# Patient Record
Sex: Male | Born: 1980 | Hispanic: Yes | Marital: Married
Health system: Southern US, Community
[De-identification: ages and names within clinical notes are randomized; demographics above are authoritative.]

---

## 2014-07-24 ENCOUNTER — Encounter (HOSPITAL_COMMUNITY): Payer: Self-pay

## 2014-07-24 ENCOUNTER — Emergency Department (HOSPITAL_COMMUNITY): Payer: Self-pay

## 2014-07-24 ENCOUNTER — Emergency Department (HOSPITAL_COMMUNITY)
Admission: EM | Admit: 2014-07-24 | Discharge: 2014-07-24 | Disposition: A | Discharge status: Home / Self Care | Payer: Self-pay | Attending: Emergency Medicine | Admitting: Emergency Medicine

## 2014-07-24 DIAGNOSIS — M549 Dorsalgia, unspecified: Secondary | ICD-10-CM | POA: Insufficient documentation

## 2014-07-24 DIAGNOSIS — R300 Dysuria: Secondary | ICD-10-CM | POA: Insufficient documentation

## 2014-07-24 LAB — URINALYSIS, ROUTINE W REFLEX MICROSCOPIC
Bilirubin Urine: NEGATIVE
Glucose, UA: NEGATIVE mg/dL
Hgb urine dipstick: NEGATIVE
Ketones, ur: NEGATIVE mg/dL
Leukocytes, UA: NEGATIVE
Nitrite: NEGATIVE
Protein, ur: NEGATIVE mg/dL
Specific Gravity, Urine: 1.025 (ref 1.005–1.030)
Urobilinogen, UA: 1 mg/dL (ref 0.0–1.0)
pH: 6 (ref 5.0–8.0)

## 2014-07-24 MED ORDER — TRAMADOL HCL 50 MG PO TABS: 50.0000 mg | ORAL_TABLET | Freq: Four times a day (QID) | ORAL | Status: AC | PRN

## 2014-07-24 MED ORDER — IBUPROFEN 600 MG PO TABS: 600.0000 mg | ORAL_TABLET | Freq: Four times a day (QID) | ORAL | Status: AC | PRN

## 2014-07-24 NOTE — ED Notes (Signed)
Declined W/C at D/C and was escorted to lobby by RN. 

## 2014-07-24 NOTE — ED Provider Notes (Signed)
CSN: [id number]     Arrival date & time 07/24/14  1315 History  This chart was scribed for non-physician practitioner, Kelle Darting Adyn Serna, PA-C, working with Eber Hong, MD by Charline Bills, ED Scribe. This patient was seen in room TR07C/TR07C and the patient's care was started at 2:58 PM.   Chief Complaint  Patient presents with  . Back Pain   The history is provided by the patient. The history is limited by a language barrier. A language interpreter was used.   HPI Comments: Dean Orozco is a 34 y.o. male, with no pertinent medical history, who presents to the Emergency Department complaining of gradually worsening back pain for 2 years. Pt initially noted back pain 2-3 years ago when Dean Orozco fell from a tree. Pt was not seen or treated after fall; Dean Orozco states that Dean Orozco just took medication for pain. Pain is exacerbated with bending, lying down, standing up and lifting. Pt denies pain at rest. Dean Orozco reports associated mild dysuria onset 3 months ago. Dean Orozco denies numbness/tingling in lower extremities, weakness in lower extremities, urinary or bowel incontinence, penile discharge, difficulty urinating. Pt has been treating with unknown medication with mild relief. No h/o liver or kidney complications.   History reviewed. No pertinent past medical history. History reviewed. No pertinent past surgical history. No family history on file. History  Substance Use Topics  . Smoking status: Not on file  . Smokeless tobacco: Not on file  . Alcohol Use: Not on file    Review of Systems  Genitourinary: Positive for dysuria. Negative for discharge and difficulty urinating.  Musculoskeletal: Positive for back pain.  Neurological: Negative for weakness and numbness.  All other systems reviewed and are negative.  Allergies  Review of patient's allergies indicates no known allergies.  Home Medications   Prior to Admission medications   Not on File   BP 124/78 mmHg  Pulse 65  Temp(Src) 98.7 F  (37.1 C)  Resp 16  SpO2 100% Physical Exam  Constitutional: Dean Orozco is oriented to person, place, and time. Dean Orozco appears well-developed and well-nourished. No distress.  HENT:  Head: Normocephalic and atraumatic.  Eyes: Conjunctivae and EOM are normal.  Neck: Neck supple. No tracheal deviation present.  Cardiovascular: Normal rate.   Pulmonary/Chest: Effort normal. No respiratory distress.  Musculoskeletal: Normal range of motion.  Acute pain with forward flexion. No radiation of symptoms with forward flexion. No c,t or l spine tenderness. No paravertebral tenderness.   Neurological: Dean Orozco is alert and oriented to person, place, and time.  Skin: Skin is warm and dry.  Psychiatric: Dean Orozco has a normal mood and affect. His behavior is normal.  Nursing note and vitals reviewed.  ED Course  Procedures (including critical care time) DIAGNOSTIC STUDIES: Oxygen Saturation is 100% on RA, normal by my interpretation.    COORDINATION OF CARE: 3:14 PM-Discussed treatment plan which includes UA, XR and follow-up with PCP with pt at bedside and pt agreed to plan.   Labs Review Labs Reviewed - No data to display  Imaging Review Dg Lumbar Spine 2-3 Views  07/24/2014   CLINICAL DATA:  Back pain beginning today. No recent injury. Initial encounter.  EXAM: LUMBAR SPINE - 2-3 VIEW  COMPARISON:  None.  FINDINGS: Vertebral body height and alignment are normal. Intervertebral disc space height is maintained with mild anterior endplate spurring at L3-4 and L4-5. Mild convex left curvature is identified. Imaged paraspinous structures are unremarkable.  IMPRESSION: No acute abnormality.  Mild convex left curvature of mild degenerative  change.   Electronically Signed   By: Drusilla Kannerhomas  Dalessio M.D.   On: 07/24/2014 16:32     EKG Interpretation None     Interpreter asked the pt multiple times the same question but pt did not directly answer the question. Dean Orozco kept referring to back pain when asked multiple times about  numbness/tingling and weakness in lower extremities.   MDM   Final diagnoses:  None   Pt was evaluated with the use of an interpretor. Pt was difficult to interview as Dean Orozco kept trying to explain his back pain and would not elaborate on other details.    Labs: Urinalysis negative  Assessment/ Plan   Pt's pain has been chronic for the last 2 years with no change in pain level. X-ray showed no signs of trauma or abnormalities. Dean Orozco has no pain at rest and only with movement. No red flags. Dean Orozco was instructed to use Ibuprofen as needed for pain and Tramadol for severe pain. It was stressed to him that further follow-up with specialist is needed for management of chronic pain. Dean Orozco was instructed to avoid heavy activities that worsen back pain and to return for worsening signs. Pt understood and agreed to plan.   I personally performed the services described in this documentation, which was scribed in my presence. The recorded information has been reviewed and is accurate.    Eyvonne MechanicJeffrey Sylvanna Burggraf, PA-C 07/24/14 1711  Eber HongBrian Miller, MD 07/24/14 [phone]

## 2014-07-24 NOTE — Discharge Instructions (Signed)

## 2014-07-24 NOTE — ED Notes (Signed)
Per interpreter, patient is complaining of back pain x1 year, denies numbness/tingling in arms or legs. States he has been seen at a clinic for the same but is out and states the pain is back.

## 2014-07-24 NOTE — ED Provider Notes (Signed)
Patient seen and examined by me. Patient with acute exacerbation of chronic pain. States he fell about 3 years ago, was not seen at that time, pain on and off since then. Pain is in the lower back, with no radiation to the extremities. No loss of bladder or bowel control. No numbness or weakness in legs. No difficulty ambulating. States pain is worsened with leaning forward and picking up heavy objects. Exam unremarkable. No evidence of cauda equina. He is afebrile. No IV drug use. X-rays and UA  Obtained and are negative. Interpreter phone was used to communicate with patient. Will discharge him home with outpatient follow-up. Patient will be given ibuprofen and tramadol for his symptoms. Return precautions discussed.  Filed Vitals:   07/24/14 1330  BP: 124/78  Pulse: 65  Temp: 98.7 F (37.1 C)  Resp: 16  SpO2: 100%     Jaynie Crumbleatyana Tonio Seider, PA-C 07/24/14 1655  Eber HongBrian Miller, MD 07/24/14 2044

## 2014-07-24 NOTE — ED Notes (Signed)
Pt to BR to collect urine sample.

## 2016-04-06 IMAGING — CR DG LUMBAR SPINE 2-3V
3 series · 3 of 3 positions shown · non-contrast
Comparison: None.

CLINICAL DATA: Back pain beginning today. No recent injury. Initial
encounter.

EXAM:
LUMBAR SPINE - 2-3 VIEW

[l-spine ap]
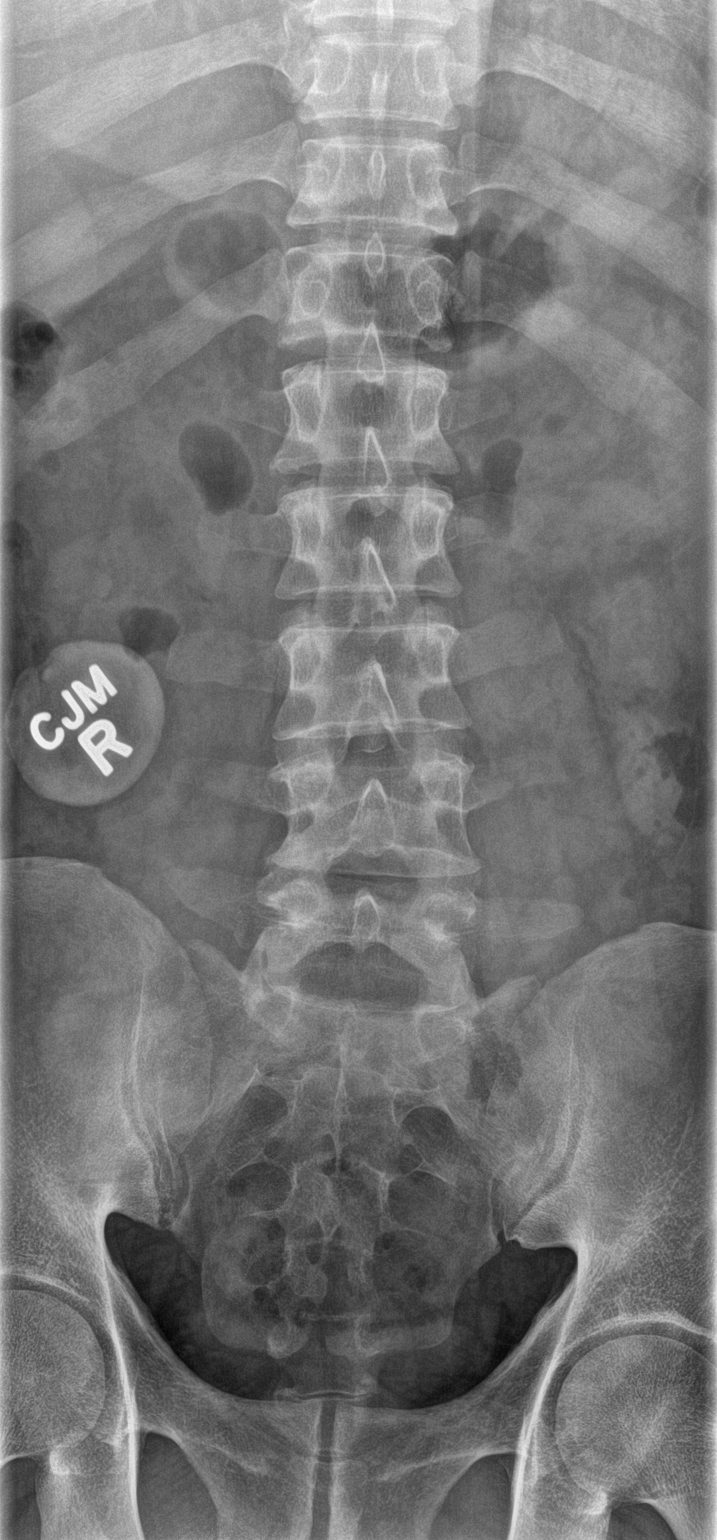

[l-spine lat]
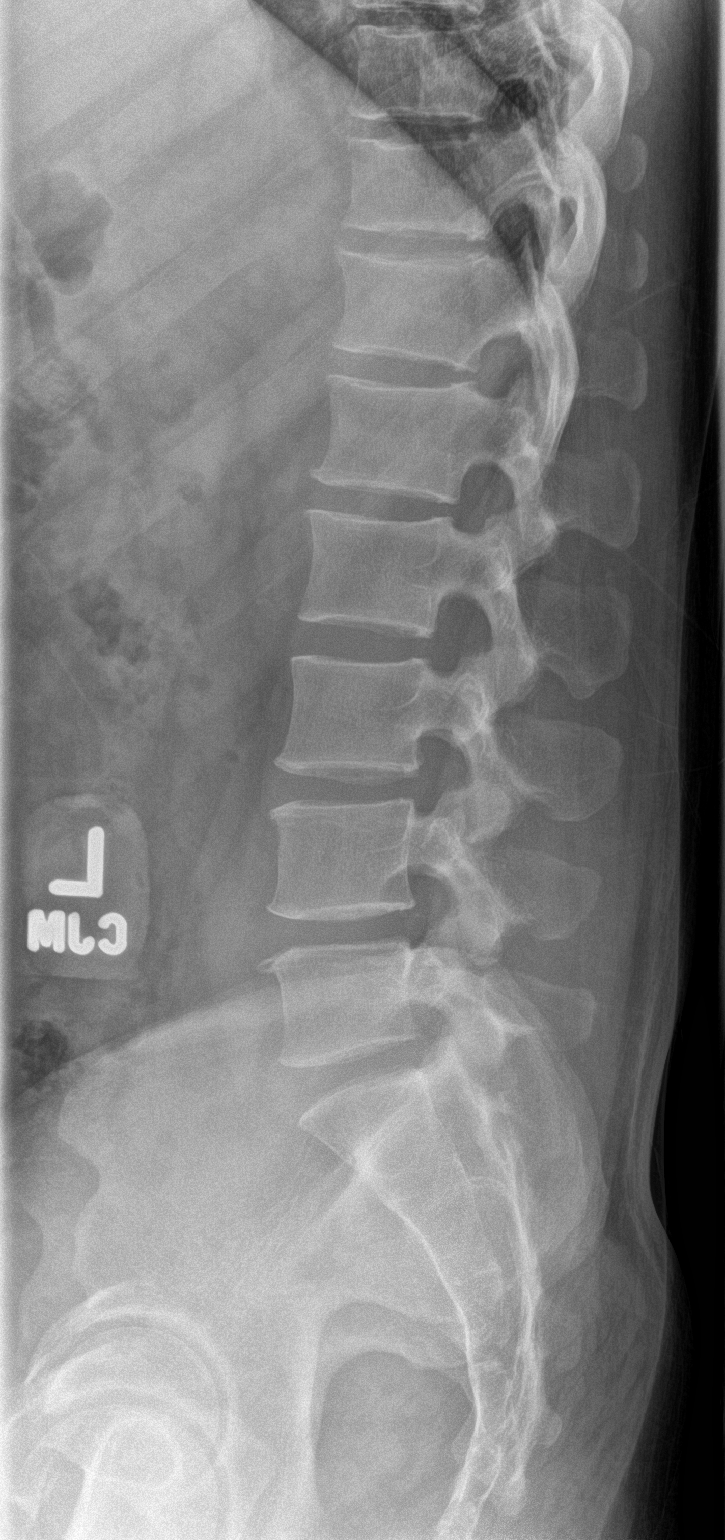

[l-spine spot]
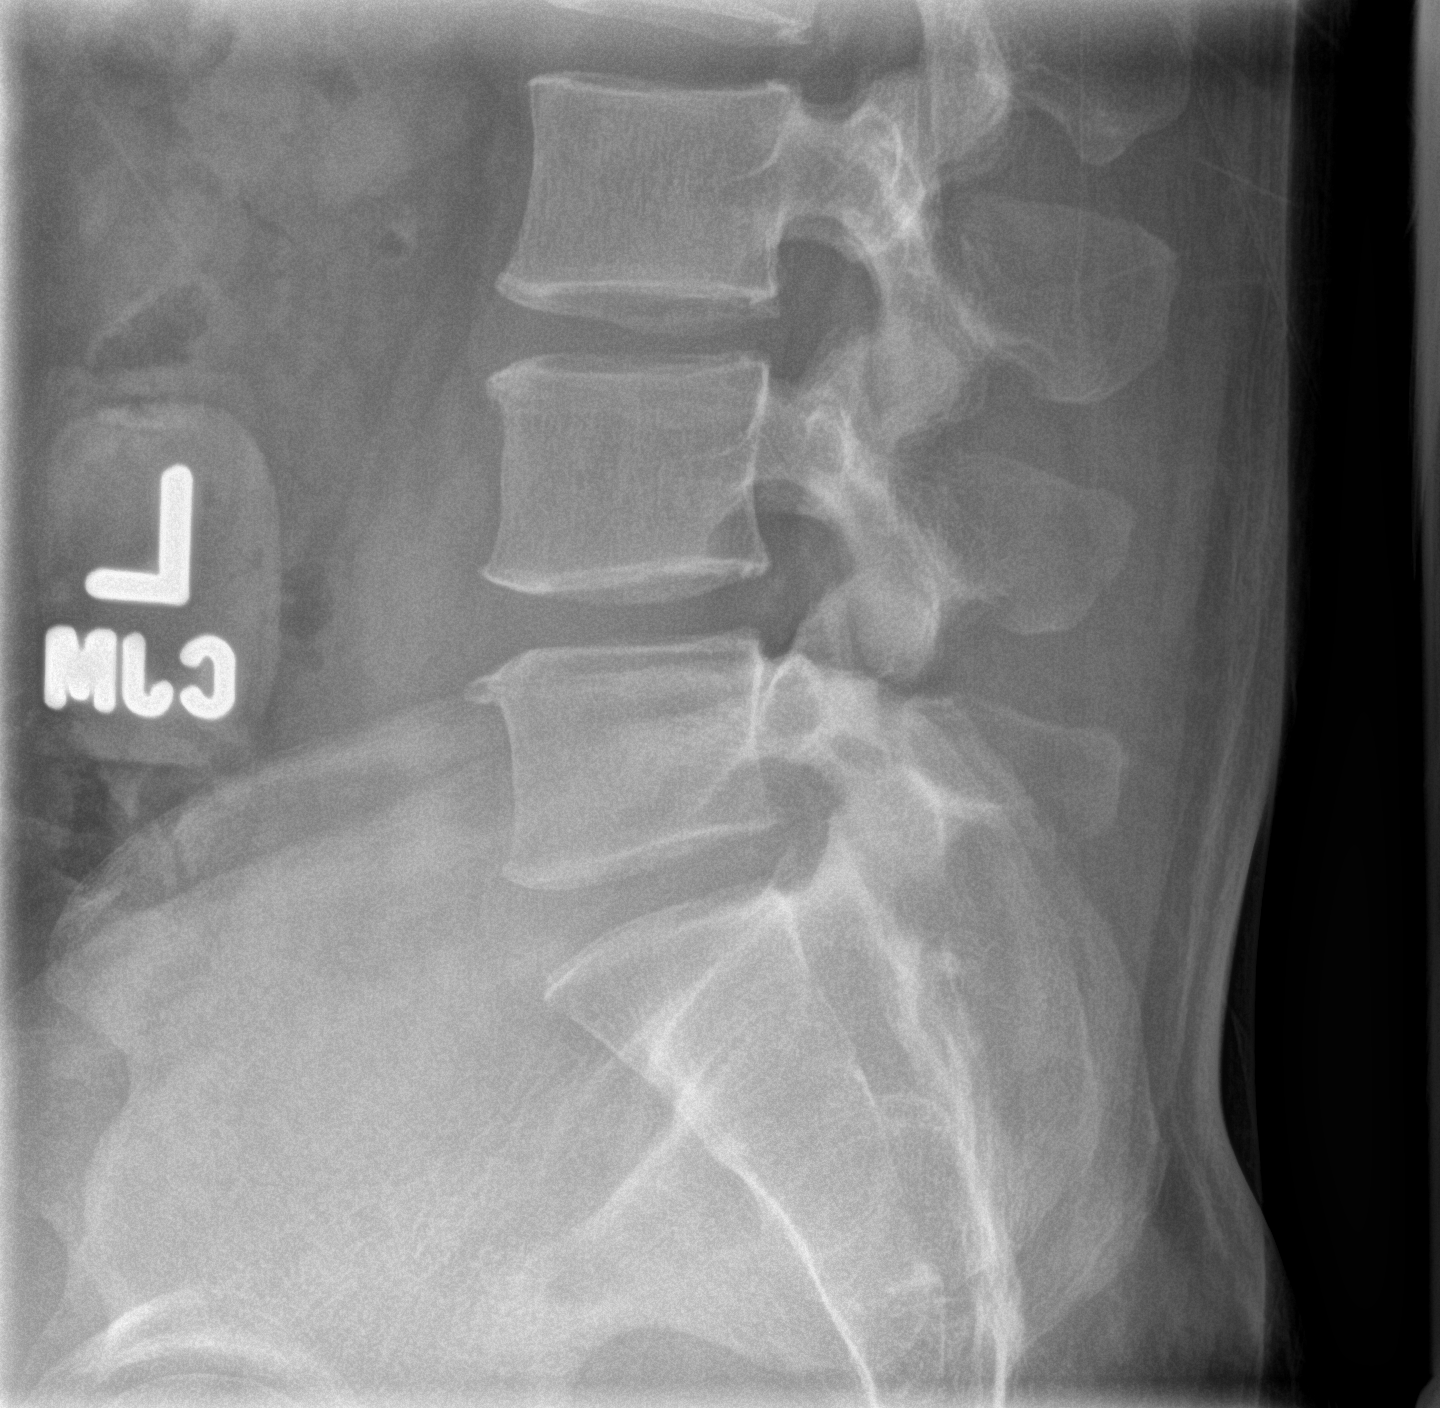

[3 of 3 positions shown; findings below may reference images not displayed]

FINDINGS: Vertebral body height and alignment are normal. Intervertebral disc
space height is maintained with mild anterior endplate spurring at
L3-4 and L4-5. Mild convex left curvature is identified. Imaged
paraspinous structures are unremarkable.
IMPRESSION: No acute abnormality.

Mild convex left curvature of mild degenerative change.
